# Patient Record
Sex: Female | Born: 2000 | Race: White | Hispanic: No | Marital: Single
Health system: Southern US, Community
[De-identification: ages and names within clinical notes are randomized; demographics above are authoritative.]

---
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
## 2023-02-09 ENCOUNTER — Other Ambulatory Visit: Payer: Self-pay

## 2023-02-09 ENCOUNTER — Ambulatory Visit (LOCAL_COMMUNITY_HEALTH_CENTER): Payer: Self-pay

## 2023-02-09 DIAGNOSIS — Z111 Encounter for screening for respiratory tuberculosis: Secondary | ICD-10-CM

## 2023-02-09 NOTE — Progress Notes (Signed)
Patient in clinic for PPD placement for daycare. Patient given instructions to not apply lotions, Band-Aid or scratch. Patient left paperwork to be completed and signed by nurse upon return to clinic on 02/12/23. Servando [place], RN

## 2023-02-12 ENCOUNTER — Ambulatory Visit (LOCAL_COMMUNITY_HEALTH_CENTER): Payer: Self-pay

## 2023-02-12 ENCOUNTER — Other Ambulatory Visit: Payer: Self-pay

## 2023-02-12 DIAGNOSIS — Z111 Encounter for screening for respiratory tuberculosis: Secondary | ICD-10-CM

## 2023-02-12 LAB — TB SKIN TEST
Induration: 0 mm
TB Skin Test: NEGATIVE

## 2023-02-12 NOTE — Addendum Note (Signed)
Addended by: Bing Ree on: 02/12/2023 09:26 AM   Modules accepted: Level of Service

## 2023-02-12 NOTE — Progress Notes (Signed)
Patient returned for PPDR. Result 0 mm negative. [facility] Results slip completed and given to patient.  Servando [place], RN

## 2024-11-21 ENCOUNTER — Ambulatory Visit
Admission: EM | Admit: 2024-11-21 | Discharge: 2024-11-21 | Disposition: A | Discharge status: Home / Self Care | Attending: Emergency Medicine | Admitting: Emergency Medicine

## 2024-11-21 DIAGNOSIS — R21 Rash and other nonspecific skin eruption: Secondary | ICD-10-CM

## 2024-11-21 MED ORDER — CETIRIZINE HCL 10 MG PO TABS: 10.0000 mg | ORAL_TABLET | Freq: Every day | ORAL | 0 refills | Status: AC

## 2024-11-21 MED ORDER — PREDNISONE 10 MG (21) PO TBPK: ORAL_TABLET | Freq: Every day | ORAL | 0 refills | Status: AC

## 2024-11-21 NOTE — ED Provider Notes (Signed)
 " [facility]    CSN: [id number] Arrival date & time: 11/21/24  1611      History   Chief Complaint Chief Complaint  Patient presents with   Rash    HPI Menucha Dicesare is a 23 y.o. female.  Patient presents with 1 day history of pruritic rash on her left hand, wrist, trunk, face.  No lesions in mouth or eyes.  No difficulty swallowing or breathing.  No new products, medications, foods.  She took Benadryl at lunchtime today.  She reports no pertinent medical history.  She denies current pregnancy or breastfeeding.     The history is provided by the patient and medical records.    History reviewed. No pertinent past medical history.  Patient Active Problem List   Diagnosis Date Noted   ACUTE PHARYNGITIS 08/17/2010   LARYNGOTRACHEOBRONCHITIS, ACUTE 08/17/2010   FEVER UNSPECIFIED 08/17/2010   ACUTE SWIMMERS EAR 07/14/2010    History reviewed. No pertinent surgical history.  OB History   No obstetric history on file.      Home Medications    Prior to Admission medications  Medication Sig Start Date End Date Taking? Authorizing Provider  cetirizine  (ZYRTEC  ALLERGY) 10 MG tablet Take 1 tablet (10 mg total) by mouth daily. 11/21/24 12/05/24 Yes Corlis Burnard DEL, NP  predniSONE  (STERAPRED UNI-PAK 21 TAB) 10 MG (21) TBPK tablet Take by mouth daily. As directed 11/21/24  Yes Corlis Burnard DEL, NP    Family History History reviewed. No pertinent family history.  Social History Social History[1]   Allergies   Penicillins   Review of Systems Review of Systems  Constitutional:  Negative for chills and fever.  HENT:  Negative for trouble swallowing and voice change.   Respiratory:  Negative for cough and shortness of breath.   Skin:  Positive for color change and rash.     Physical Exam Triage Vital Signs ED Triage Vitals  Encounter Vitals Group     BP 11/21/24 1723 125/82     Girls Systolic BP Percentile --      Girls Diastolic BP Percentile --       Boys Systolic BP Percentile --      Boys Diastolic BP Percentile --      Pulse Rate 11/21/24 1723 95     Resp 11/21/24 1723 18     Temp 11/21/24 1723 98.3 F (36.8 C)     Temp Source 11/21/24 1723 Oral     SpO2 11/21/24 1723 98 %     Weight 11/21/24 1721 119 lb (54 kg)     Height --      Head Circumference --      Peak Flow --      Pain Score 11/21/24 1721 0     Pain Loc --      Pain Education --      Exclude from Growth Chart --    No data found.  Updated Vital Signs BP 125/82 (BP Location: Right Arm)   Pulse 95   Temp 98.3 F (36.8 C) (Oral)   Resp 18   Wt 119 lb (54 kg)   LMP 10/24/2024 (Exact Date)   SpO2 98%   Visual Acuity Right Eye Distance:   Left Eye Distance:   Bilateral Distance:    Right Eye Near:   Left Eye Near:    Bilateral Near:     Physical Exam Constitutional:      General: She is not in acute distress. HENT:  Mouth/Throat:     Mouth: Mucous membranes are moist.     Pharynx: Oropharynx is clear.  Eyes:     General:        Right eye: No discharge.        Left eye: No discharge.     Extraocular Movements: Extraocular movements intact.     Conjunctiva/sclera: Conjunctivae normal.     Pupils: Pupils are equal, round, and reactive to light.  Cardiovascular:     Rate and Rhythm: Normal rate and regular rhythm.     Heart sounds: Normal heart sounds.  Pulmonary:     Effort: Pulmonary effort is normal. No respiratory distress.     Breath sounds: Normal breath sounds.  Skin:    General: Skin is warm and dry.     Findings: Rash present.     Comments: Scattered hive-like rash on face, trunk, and extremities.  No open lesions or drainage.  Neurological:     Mental Status: She is alert.      UC Treatments / Results  Labs (all labs ordered are listed, but only abnormal results are displayed) Labs Reviewed - No data to display  EKG   Radiology No results found.  Procedures Procedures (including critical care time)  Medications  Ordered in UC Medications - No data to display  Initial Impression / Assessment and Plan / UC Course  I have reviewed the triage vital signs and the nursing notes.  Pertinent labs & imaging results that were available during my care of the patient were reviewed by me and considered in my medical decision making (see chart for details).    Rash.  Afebrile and vital signs are stable.  No lesions in mouth or eyes.  Patient declines steroid injection today.  Treating with prednisone  and Zyrtec .  Education provided on rash.  ED precautions given.  Instructed her to follow-up with her PCP on Monday.  She agrees to plan of care.  Final Clinical Impressions(s) / UC Diagnoses   Final diagnoses:  Rash     Discharge Instructions      Take Zyrtec  and prednisone  as directed.    Follow up with your primary care provider on Monday.   Go to the emergency department if you have worsening symptoms.         ED Prescriptions     Medication Sig Dispense Auth. Provider   predniSONE  (STERAPRED UNI-PAK 21 TAB) 10 MG (21) TBPK tablet Take by mouth daily. As directed 21 tablet Corlis Burnard DEL, NP   cetirizine  (ZYRTEC  ALLERGY) 10 MG tablet Take 1 tablet (10 mg total) by mouth daily. 14 tablet Corlis Burnard DEL, NP      PDMP not reviewed this encounter.    [1]  Social History Tobacco Use   Smoking status: Never   Smokeless tobacco: Never  Vaping Use   Vaping status: Never Used     Corlis Burnard DEL, NP 11/21/24 1822  "

## 2024-11-21 NOTE — Discharge Instructions (Addendum)
 Take Zyrtec  and prednisone  as directed.    Follow up with your primary care provider on Monday.   Go to the emergency department if you have worsening symptoms.

## 2024-11-21 NOTE — ED Triage Notes (Signed)
 Patient states that she noticed a rash yesterday that has spread  over her body. Doesn't hurt, just itches. Patient states that the rash started on her fingers and moved to her eyes.
